# Patient Record
Sex: Female | Born: 2000 | Race: Black or African American | Hispanic: No | Marital: Single
Health system: Southern US, Community
[De-identification: ages and names within clinical notes are randomized; demographics above are authoritative.]

## PROBLEM LIST (undated history)

## (undated) DIAGNOSIS — J45909 Unspecified asthma, uncomplicated: Secondary | ICD-10-CM

## (undated) DIAGNOSIS — L309 Dermatitis, unspecified: Secondary | ICD-10-CM

## (undated) DIAGNOSIS — J309 Allergic rhinitis, unspecified: Secondary | ICD-10-CM

## (undated) HISTORY — DX: Allergic rhinitis, unspecified: J30.9

## (undated) HISTORY — DX: Dermatitis, unspecified: L30.9

## (undated) HISTORY — DX: Unspecified asthma, uncomplicated: J45.909

---
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
## 2020-12-09 ENCOUNTER — Emergency Department (HOSPITAL_COMMUNITY)
Admission: EM | Admit: 2020-12-09 | Discharge: 2020-12-09 | Disposition: A | Discharge status: Home / Self Care | Payer: Medicaid Other | Attending: Emergency Medicine | Admitting: Emergency Medicine

## 2020-12-09 ENCOUNTER — Encounter (HOSPITAL_COMMUNITY): Payer: Self-pay | Admitting: Emergency Medicine

## 2020-12-09 ENCOUNTER — Other Ambulatory Visit: Payer: Self-pay

## 2020-12-09 ENCOUNTER — Emergency Department (HOSPITAL_COMMUNITY): Payer: Medicaid Other

## 2020-12-09 DIAGNOSIS — J4541 Moderate persistent asthma with (acute) exacerbation: Secondary | ICD-10-CM | POA: Diagnosis not present

## 2020-12-09 DIAGNOSIS — Z7952 Long term (current) use of systemic steroids: Secondary | ICD-10-CM | POA: Insufficient documentation

## 2020-12-09 DIAGNOSIS — Z7722 Contact with and (suspected) exposure to environmental tobacco smoke (acute) (chronic): Secondary | ICD-10-CM | POA: Insufficient documentation

## 2020-12-09 DIAGNOSIS — R0602 Shortness of breath: Secondary | ICD-10-CM | POA: Insufficient documentation

## 2020-12-09 MED ORDER — ALBUTEROL SULFATE HFA 108 (90 BASE) MCG/ACT IN AERS
2.0000 | INHALATION_SPRAY | RESPIRATORY_TRACT | Status: DC | PRN
  Filled 2020-12-09: qty 6.7

## 2020-12-09 MED ORDER — PREDNISONE 10 MG PO TABS: 20.0000 mg | ORAL_TABLET | Freq: Two times a day (BID) | ORAL | 0 refills | Status: DC

## 2020-12-09 MED ORDER — PREDNISONE 20 MG PO TABS
20.0000 mg | ORAL_TABLET | Freq: Once | ORAL | Status: AC
  Administered 2020-12-09: 20 mg via ORAL
  Filled 2020-12-09: qty 1

## 2020-12-09 MED ORDER — ALBUTEROL SULFATE (2.5 MG/3ML) 0.083% IN NEBU
INHALATION_SOLUTION | RESPIRATORY_TRACT | Status: AC
  Administered 2020-12-09: 5 mg
  Filled 2020-12-09: qty 6

## 2020-12-09 MED ORDER — PREDNISONE 10 MG PO TABS: 20.0000 mg | ORAL_TABLET | Freq: Two times a day (BID) | ORAL | 0 refills | Status: AC

## 2020-12-09 MED ORDER — IPRATROPIUM BROMIDE 0.02 % IN SOLN
RESPIRATORY_TRACT | Status: AC
  Administered 2020-12-09: 0.5 mg
  Filled 2020-12-09: qty 2.5

## 2020-12-09 NOTE — ED Triage Notes (Signed)
Pt states she has been short of breath for a couple of weeks. Pt has Asthma and states she has been using her inhaler but that "it doesn't seem to be working".

## 2020-12-09 NOTE — Discharge Instructions (Signed)
Begin taking prednisone as prescribed.  Use the albuterol inhaler you were provided with this evening, 2 puffs every 4 hours as needed for difficulty breathing.  Return to the emergency department if you develop chest pain, high fevers, worsening breathing, or other new and concerning symptoms.

## 2020-12-09 NOTE — ED Provider Notes (Signed)
[facility] EMERGENCY DEPARTMENT Provider Note   CSN: [id number] Arrival date & time: 12/09/20  2253     History Chief Complaint  Patient presents with  . Shortness of Breath    Stacey Webb is a 20 y.o. female.  Patient is a 20 year old female with history of asthma.  She presents today for evaluation of shortness of breath.  This has been worsening over the past week.  She describes feeling short of breath when she lays down to go to sleep primarily.  She denies any fevers, chills, chest pain, cough, or leg swelling.  She has tried using her inhaler with little relief, but it appears as though the inhaler expired in January 2021.  The history is provided by the patient.  Shortness of Breath Severity:  Moderate Onset quality:  Gradual Duration:  1 week Timing:  Constant Progression:  Worsening Chronicity:  Recurrent Relieved by:  Nothing Worsened by:  Nothing Ineffective treatments:  Inhaler Associated symptoms: no fever and no sputum production        Past Medical History:  Diagnosis Date  . Allergic rhinitis   . Asthma   . Eczema     Patient Active Problem List   Diagnosis Date Noted  . Mild persistent asthma with acute exacerbation 10/17/2014  . Rhinitis, allergic 10/17/2014  . Acanthosis nigricans 09/08/2013  . Eczema 09/07/2013  . 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 Obesity peds (BMI >=95 percentile) 09/07/2013    History reviewed. No pertinent surgical history.   OB History   No obstetric history on file.     History reviewed. No pertinent family history.  Social History   Tobacco Use  . Smoking status: Passive Smoke Exposure - Never Smoker  . Smokeless tobacco: Never Used  Vaping Use  . Vaping Use: Never used  Substance Use Topics  . Alcohol use: Never  . Drug use: Never    Home Medications Prior to Admission medications   Medication Sig Start Date End Date Taking? Authorizing Provider  acetaminophen (TYLENOL) 500 MG tablet Take 1  tablet (500 mg total) by mouth every 6 (six) hours as needed for moderate pain. 11/21/18   Burgess Amor, PA-C  albuterol (PROVENTIL HFA;VENTOLIN HFA) 108 (90 BASE) MCG/ACT inhaler Inhale 2 puffs into the lungs every 4 (four) hours as needed for wheezing or shortness of breath (always use with spacer). 01/20/15   Radene Gunning, MD  beclomethasone (QVAR) 80 MCG/ACT inhaler Inhale 2 puffs into the lungs 2 (two) times daily. 01/20/15   Radene Gunning, MD  ferrous sulfate 325 (65 FE) MG tablet Take 1 tablet (325 mg total) by mouth daily. 11/21/18   Burgess Amor, PA-C  fluticasone (FLONASE) 50 MCG/ACT nasal spray Place 2 sprays into both nostrils daily. 01/20/15   Radene Gunning, MD  ibuprofen (ADVIL) 600 MG tablet Take 1 tablet (600 mg total) by mouth every 6 (six) hours as needed. 07/21/19   Burgess Amor, PA-C  loratadine (CLARITIN) 5 MG/5ML syrup Take 10 mLs (10 mg total) by mouth daily. 01/20/15   Radene Gunning, MD  ondansetron (ZOFRAN ODT) 4 MG disintegrating tablet Take 1 tablet (4 mg total) by mouth every 8 (eight) hours as needed for nausea or vomiting. 08/24/18   Idol, Raynelle Fanning, PA-C  OVER THE COUNTER MEDICATION Take 15 mLs by mouth daily as needed (pain). "Pedicare"    [provider]    Allergies    Patient has no known allergies.  Review of Systems  Review of Systems  Constitutional: Negative for fever.  Respiratory: Positive for shortness of breath. Negative for sputum production.   All other systems reviewed and are negative.   Physical Exam Updated Vital Signs BP 112/71 (BP Location: Left Arm)   Pulse 80   Temp 98.1 F (36.7 C) (Oral)   Resp 18   Ht 5\' 5"  (1.651 m)   Wt 104 kg   LMP 12/09/2020 (Exact Date)   SpO2 100%   BMI 38.15 kg/m   Physical Exam Vitals and nursing note reviewed.  Constitutional:      General: She is not in acute distress.    Appearance: She is well-developed. She is not diaphoretic.  HENT:     Head: Normocephalic and atraumatic.   Cardiovascular:     Rate and Rhythm: Normal rate and regular rhythm.     Heart sounds: No murmur heard. No friction rub. No gallop.   Pulmonary:     Effort: Pulmonary effort is normal. No respiratory distress.     Breath sounds: Normal breath sounds. No wheezing.  Abdominal:     General: Bowel sounds are normal. There is no distension.     Palpations: Abdomen is soft.     Tenderness: There is no abdominal tenderness.  Musculoskeletal:        General: Normal range of motion.     Cervical back: Normal range of motion and neck supple.  Skin:    General: Skin is warm and dry.  Neurological:     Mental Status: She is alert and oriented to person, place, and time.     ED Results / Procedures / Treatments   Labs (all labs ordered are listed, but only abnormal results are displayed) Labs Reviewed - No data to display  EKG None  Radiology DG Chest Portable 1 View  Result Date: 12/09/2020 CLINICAL DATA:  Shortness of breath. EXAM: PORTABLE CHEST 1 VIEW COMPARISON:  August 24, 2018 FINDINGS: The heart size and mediastinal contours are within normal limits. Both lungs are clear. The visualized skeletal structures are unremarkable. IMPRESSION: No active disease. Electronically Signed   By: August 26, 2018 M.D.   On: 12/09/2020 23:18    Procedures Procedures   Medications Ordered in ED Medications  albuterol (VENTOLIN HFA) 108 (90 Base) MCG/ACT inhaler 2 puff (has no administration in time range)  predniSONE (DELTASONE) tablet 20 mg (has no administration in time range)  ipratropium (ATROVENT) 0.02 % nebulizer solution (0.5 mg  Given 12/09/20 2307)  albuterol (PROVENTIL) (2.5 MG/3ML) 0.083% nebulizer solution (5 mg  Given 12/09/20 2307)    ED Course  I have reviewed the triage vital signs and the nursing notes.  Pertinent labs & imaging results that were available during my care of the patient were reviewed by me and considered in my medical decision making (see chart for  details).    MDM Rules/Calculators/A&P  Patient presenting here with dyspnea and history of asthma.  I suspect a mild flareup of this.  Her chest x-ray is clear.  There are no infectious like symptoms and highly doubt COVID or other URI/pneumonia.  Patient's inhaler will be updated and patient to be given prednisone.  She is to return as needed if symptoms worsen or change.  Vital signs are stable and oxygen saturations 100%.  There is no tachypnea or respiratory distress.  Lungs are clear to auscultation.  Final Clinical Impression(s) / ED Diagnoses Final diagnoses:  None    Rx / DC Orders ED Discharge Orders  None       Geoffery Lyons, MD 12/09/20 2330

## 2021-12-28 ENCOUNTER — Encounter (HOSPITAL_COMMUNITY): Payer: Self-pay

## 2021-12-28 ENCOUNTER — Emergency Department (HOSPITAL_COMMUNITY): Payer: Medicaid Other

## 2021-12-28 ENCOUNTER — Other Ambulatory Visit: Payer: Self-pay

## 2021-12-28 DIAGNOSIS — W1839XA Other fall on same level, initial encounter: Secondary | ICD-10-CM | POA: Diagnosis not present

## 2021-12-28 DIAGNOSIS — S40021A Contusion of right upper arm, initial encounter: Secondary | ICD-10-CM | POA: Diagnosis not present

## 2021-12-28 DIAGNOSIS — S4991XA Unspecified injury of right shoulder and upper arm, initial encounter: Secondary | ICD-10-CM | POA: Diagnosis present
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
## 2021-12-29 ENCOUNTER — Emergency Department (HOSPITAL_COMMUNITY)
Admission: EM | Admit: 2021-12-29 | Discharge: 2021-12-29 | Disposition: A | Discharge status: Home / Self Care | Payer: Medicaid Other | Attending: Emergency Medicine | Admitting: Emergency Medicine

## 2021-12-29 DIAGNOSIS — S40021A Contusion of right upper arm, initial encounter: Secondary | ICD-10-CM

## 2021-12-29 NOTE — ED Provider Notes (Signed)
[facility] Provider Note   CSN: [id number] Arrival date & time: 12/28/21  2237     History  No chief complaint on file.   Stacey Webb is a 21 y.o. female.  Patient presents for evaluation of right arm injury.  Patient reports that she fell earlier tonight and injured the right upper arm.  Patient reports pain with movement.  No head injury, loss of consciousness.      Home Medications Prior to Admission medications   Medication Sig Start Date End Date Taking? Authorizing Provider  acetaminophen (TYLENOL) 500 MG tablet Take 1 tablet (500 mg total) by mouth every 6 (six) hours as needed for moderate pain. 11/21/18   Evalee Jefferson, PA-C  albuterol (PROVENTIL HFA;VENTOLIN HFA) 108 (90 BASE) MCG/ACT inhaler Inhale 2 puffs into the lungs every 4 (four) hours as needed for wheezing or shortness of breath (always use with spacer). 01/20/15   Valda Favia, MD  beclomethasone (QVAR) 80 MCG/ACT inhaler Inhale 2 puffs into the lungs 2 (two) times daily. 01/20/15   Valda Favia, MD  ferrous sulfate 325 (65 FE) MG tablet Take 1 tablet (325 mg total) by mouth daily. 11/21/18   Evalee Jefferson, PA-C  fluticasone (FLONASE) 50 MCG/ACT nasal spray Place 2 sprays into both nostrils daily. 01/20/15   Valda Favia, MD  ibuprofen (ADVIL) 600 MG tablet Take 1 tablet (600 mg total) by mouth every 6 (six) hours as needed. 07/21/19   Evalee Jefferson, PA-C  loratadine (CLARITIN) 5 MG/5ML syrup Take 10 mLs (10 mg total) by mouth daily. 01/20/15   Valda Favia, MD  ondansetron (ZOFRAN ODT) 4 MG disintegrating tablet Take 1 tablet (4 mg total) by mouth every 8 (eight) hours as needed for nausea or vomiting. 08/24/18   Idol, Almyra Free, PA-C  OVER THE COUNTER MEDICATION Take 15 mLs by mouth daily as needed (pain). "Pedicare"    [provider]  predniSONE (DELTASONE) 10 MG tablet Take 2 tablets (20 mg total) by mouth 2 (two) times daily. 12/09/20   Veryl Speak, MD      Allergies    Patient  has no known allergies.    Review of Systems   Review of Systems  Physical Exam Updated Vital Signs BP 122/72 (BP Location: Left Arm)   Pulse 84   Temp 97.9 F (36.6 C) (Oral)   Resp 18   LMP 12/20/2021   SpO2 100%  Physical Exam Vitals and nursing note reviewed.  Constitutional:      General: She is not in acute distress.    Appearance: She is well-developed.  HENT:     Head: Normocephalic and atraumatic.     Mouth/Throat:     Mouth: Mucous membranes are moist.  Eyes:     General: Vision grossly intact. Gaze aligned appropriately.     Extraocular Movements: Extraocular movements intact.     Conjunctiva/sclera: Conjunctivae normal.  Cardiovascular:     Rate and Rhythm: Normal rate and regular rhythm.     Pulses: Normal pulses.     Heart sounds: Normal heart sounds, S1 normal and S2 normal. No murmur heard.   No friction rub. No gallop.  Pulmonary:     Effort: Pulmonary effort is normal. No respiratory distress.     Breath sounds: Normal breath sounds.  Abdominal:     General: Bowel sounds are normal.     Palpations: Abdomen is soft.     Tenderness: There is no abdominal tenderness. There is no guarding  or rebound.     Hernia: No hernia is present.  Musculoskeletal:        General: No swelling.     Right upper arm: Tenderness present. No swelling, deformity, lacerations or bony tenderness.     Cervical back: Full passive range of motion without pain, normal range of motion and neck supple. No spinous process tenderness or muscular tenderness. Normal range of motion.     Right lower leg: No edema.     Left lower leg: No edema.  Skin:    General: Skin is warm and dry.     Capillary Refill: Capillary refill takes less than 2 seconds.     Findings: No ecchymosis, erythema, rash or wound.  Neurological:     General: No focal deficit present.     Mental Status: She is alert and oriented to person, place, and time.     GCS: GCS eye subscore is 4. GCS verbal subscore is 5.  GCS motor subscore is 6.     Cranial Nerves: Cranial nerves 2-12 are intact.     Sensory: Sensation is intact.     Motor: Motor function is intact.     Coordination: Coordination is intact.  Psychiatric:        Attention and Perception: Attention normal.        Mood and Affect: Mood normal.        Speech: Speech normal.        Behavior: Behavior normal.    ED Results / Procedures / Treatments   Labs (all labs ordered are listed, but only abnormal results are displayed) Labs Reviewed - No data to display  EKG None  Radiology DG Humerus Right  Result Date: 12/28/2021 CLINICAL DATA:  Pain upper humerus. Right arm pain after trying to get a Bard out of her house. EXAM: RIGHT HUMERUS - 2+ VIEW COMPARISON:  None Available. FINDINGS: There is no evidence of fracture or other focal bone lesions. Soft tissues are unremarkable. IMPRESSION: Negative. Electronically Signed   By: Keane Police D.O.   On: 12/28/2021 23:06    Procedures Procedures    Medications Ordered in ED Medications - No data to display  ED Course/ Medical Decision Making/ A&P                           Medical Decision Making Problems Addressed: Arm contusion, right, initial encounter: acute illness or injury  Amount and/or Complexity of Data Reviewed Radiology: ordered and independent interpretation performed.   Patient with isolated right upper arm pain after a fall.  Normal range of motion of the right shoulder.  No tenderness of the elbow or wrist.  X-ray negative.  No other injury.        Final Clinical Impression(s) / ED Diagnoses Final diagnoses:  Arm contusion, right, initial encounter    Rx / DC Orders ED Discharge Orders     None         Yasmina Chico, Gwenyth Allegra, MD 12/29/21 [phone]
